# Patient Record
Sex: Male | Born: 1960 | Race: White | Hispanic: No | Marital: Married | Smoking: Current every day smoker
Health system: Southern US, Community
[De-identification: ages and names within clinical notes are randomized; demographics above are authoritative.]

## PROBLEM LIST (undated history)

## (undated) DIAGNOSIS — Z86010 Personal history of colonic polyps: Secondary | ICD-10-CM

## (undated) HISTORY — DX: Personal history of colonic polyps: Z86.010

## (undated) HISTORY — PX: COLONOSCOPY: SHX174

---

## 2000-07-04 ENCOUNTER — Encounter: Admission: RE | Admit: 2000-07-04 | Discharge: 2000-07-04 | Payer: Self-pay | Admitting: Family Medicine

## 2000-07-04 ENCOUNTER — Encounter: Payer: Self-pay | Admitting: Family Medicine

## 2009-08-06 ENCOUNTER — Ambulatory Visit: Payer: Self-pay | Admitting: Diagnostic Radiology

## 2009-08-06 ENCOUNTER — Emergency Department (HOSPITAL_BASED_OUTPATIENT_CLINIC_OR_DEPARTMENT_OTHER): Admission: EM | Admit: 2009-08-06 | Discharge: 2009-08-06 | Payer: Self-pay | Admitting: Emergency Medicine

## 2009-08-11 ENCOUNTER — Emergency Department (HOSPITAL_BASED_OUTPATIENT_CLINIC_OR_DEPARTMENT_OTHER): Admission: EM | Admit: 2009-08-11 | Discharge: 2009-08-11 | Payer: Self-pay | Admitting: Emergency Medicine

## 2010-02-01 ENCOUNTER — Other Ambulatory Visit: Payer: Self-pay | Admitting: Emergency Medicine

## 2010-02-01 ENCOUNTER — Observation Stay (HOSPITAL_COMMUNITY): Admission: AD | Admit: 2010-02-01 | Discharge: 2010-02-03 | Payer: Self-pay | Admitting: Internal Medicine

## 2010-03-26 ENCOUNTER — Encounter (INDEPENDENT_AMBULATORY_CARE_PROVIDER_SITE_OTHER): Payer: Self-pay | Admitting: *Deleted

## 2010-04-28 ENCOUNTER — Encounter (INDEPENDENT_AMBULATORY_CARE_PROVIDER_SITE_OTHER): Payer: Self-pay | Admitting: *Deleted

## 2010-04-30 ENCOUNTER — Ambulatory Visit: Payer: Self-pay | Admitting: Internal Medicine

## 2010-05-14 ENCOUNTER — Ambulatory Visit: Payer: Self-pay | Admitting: Internal Medicine

## 2010-05-14 DIAGNOSIS — Z860100 Personal history of colon polyps, unspecified: Secondary | ICD-10-CM

## 2010-05-14 HISTORY — DX: Personal history of colon polyps, unspecified: Z86.0100

## 2010-05-14 HISTORY — DX: Personal history of colonic polyps: Z86.010

## 2010-05-24 ENCOUNTER — Encounter: Payer: Self-pay | Admitting: Internal Medicine

## 2010-08-31 NOTE — Miscellaneous (Signed)
Summary: [facility]  Clinical Lists Changes  Medications: Added new medication of MOVIPREP 100 GM  SOLR (PEG-KCL-NACL-NASULF-NA ASC-C) As per prep instructions. - Signed Rx of MOVIPREP 100 GM  SOLR (PEG-KCL-NACL-NASULF-NA ASC-C) As per prep instructions.;  #1 x 0;  Signed;  Entered by: Ezra Sites RN;  Authorized by: Iva Boop MD, [facility];  Method used: Electronically to [facility]. [phone]*, [address] [place], [place]  [address], Ph: [phone], Fax: [phone] Observations: Added new observation of NKA: T (04/30/2010 15:17)    Prescriptions: MOVIPREP 100 GM  SOLR (PEG-KCL-NACL-NASULF-NA ASC-C) As per prep instructions.  #1 x 0   Entered by:   Ezra Sites RN   Authorized by:   Iva Boop MD, [facility]   Signed by:   Ezra Sites RN on 04/30/2010   Method used:   Electronically to        [facility] [phone]* (retail)       [address]       [place], [place]  [address]       Ph: [phone]       Fax: [phone]   RxID:   [id number]

## 2010-08-31 NOTE — Letter (Signed)
Summary: Moviprep Instructions  [facility]  [address]   [place], [place] [address]   Phone: [phone]  Fax: [phone]       Chad Bass    01-26-1961    MRN: [id number]        Procedure Day Dorna Bloom: Friday, 05-14-10     Arrival Time: 10:30 a.m.     Procedure Time: 11:30 a.m.     Location of Procedure:                    x  [facility] (4th Floor)   PREPARATION FOR COLONOSCOPY WITH MOVIPREP   Starting 5 days prior to your procedure 05-09-10 do not eat nuts, seeds, popcorn, corn, beans, peas,  salads, or any raw vegetables.  Do not take any fiber supplements (e.g. Metamucil, Citrucel, and Benefiber).  THE DAY BEFORE YOUR PROCEDURE         DATE: 05-13-10  DAY: Thursday  1.  Drink clear liquids the entire day-NO SOLID FOOD  2.  Do not drink anything colored red or purple.  Avoid juices with pulp.  No orange juice.  3.  Drink at least 64 oz. (8 glasses) of fluid/clear liquids during the day to prevent dehydration and help the prep work efficiently.  CLEAR LIQUIDS INCLUDE: Water Jello Ice Popsicles Tea (sugar ok, no milk/cream) Powdered fruit flavored drinks Coffee (sugar ok, no milk/cream) Gatorade Juice: apple, white grape, white cranberry  Lemonade Clear bullion, consomm, broth Carbonated beverages (any kind) Strained chicken noodle soup Hard Candy                             4.  In the morning, mix first dose of MoviPrep solution:    Empty 1 Pouch A and 1 Pouch B into the disposable container    Add lukewarm drinking water to the top line of the container. Mix to dissolve    Refrigerate (mixed solution should be used within 24 hrs)  5.  Begin drinking the prep at 5:00 p.m. The MoviPrep container is divided by 4 marks.   Every 15 minutes drink the solution down to the next mark (approximately 8 oz) until the full liter is complete.   6.  Follow completed prep with 16 oz of clear liquid of your choice (Nothing red or  purple).  Continue to drink clear liquids until bedtime.  7.  Before going to bed, mix second dose of MoviPrep solution:    Empty 1 Pouch A and 1 Pouch B into the disposable container    Add lukewarm drinking water to the top line of the container. Mix to dissolve    Refrigerate  THE DAY OF YOUR PROCEDURE      DATE: 05-14-10  DAY: Friday  Beginning at  6:30 a.m. (5 hours before procedure):         1. Every 15 minutes, drink the solution down to the next mark (approx 8 oz) until the full liter is complete.  2. Follow completed prep with 16 oz. of clear liquid of your choice.    3. You may drink clear liquids until 9:30 a.m.  (2 HOURS BEFORE PROCEDURE).   MEDICATION INSTRUCTIONS  Unless otherwise instructed, you should take regular prescription medications with a small sip of water   as early as possible the morning of your procedure.           OTHER INSTRUCTIONS  You will need a  responsible adult at least 51 years of age to accompany you and drive you home.   This person must remain in the waiting room during your procedure.  Wear loose fitting clothing that is easily removed.  Leave jewelry and other valuables at home.  However, you may wish to bring a book to read or  an iPod/MP3 player to listen to music as you wait for your procedure to start.  Remove all body piercing jewelry and leave at home.  Total time from sign-in until discharge is approximately 2-3 hours.  You should go home directly after your procedure and rest.  You can resume normal activities the  day after your procedure.  The day of your procedure you should not:   Drive   Make legal decisions   Operate machinery   Drink alcohol   Return to work  You will receive specific instructions about eating, activities and medications before you leave.    The above instructions have been reviewed and explained to me by   Ezra Sites RN  April 30, 2010 3:38 PM     I fully understand and  can verbalize these instructions _____________________________ Date _________

## 2010-08-31 NOTE — Procedures (Signed)
Summary: Colonoscopy  Patient: [name] Note: All result statuses are Final unless otherwise noted.  Tests: (1) Colonoscopy (COL)   COL Colonoscopy           DONE (C)      [facility]     [address]     [place], [place]  [address]           COLONOSCOPY PROCEDURE REPORT           PATIENT:  Chad Bass, Chad Bass  MR#:  [id number]     BIRTHDATE:  29-Jun-1961, 49 yrs. old  GENDER:  male     ENDOSCOPIST:  Iva Boop, MD, [facility]     REF. BY:  Marinda Elk, M.D.     PROCEDURE DATE:  05/14/2010     PROCEDURE:  Colonoscopy with snare polypectomy     ASA CLASS:  Class I     INDICATIONS:  Routine Risk Screening     MEDICATIONS:   Fentanyl 50 mcg IV, Versed 5 mg IV           DESCRIPTION OF PROCEDURE:   After the risks benefits and     alternatives of the procedure were thoroughly explained, informed     consent was obtained.  Digital rectal exam was performed and     revealed no abnormalities and normal prostate.   The LB 180AL     [id number] endoscope was introduced through the anus and advanced to     the cecum, which was identified by both the appendix and ileocecal     valve, without limitations.  The quality of the prep was     excellent, using MoviPrep.  The instrument was then slowly     withdrawn as the colon was fully examined.     Insertion: 3:40 minutes Withdrawal: 8:47 minutes     <<PROCEDUREIMAGES>>           FINDINGS:  Three polyps were found. Descending (2) and rectum (1).     The rectal (3mm) and smaller descending colon polyp (57mm)were     snared without cautery. Retrieval was successful. The 10 mm     descending polyp was snared, then cauterized with monopolar     cautery. Retrieval was successful. snare polyp  Mild     diverticulosis was found in the sigmoid colon.  This was otherwise     a normal examination of the colon.   Retroflexed views in the     right colon and  rectum revealed no abnormalities.    The scope     was then withdrawn from the patient and  the procedure completed.           COMPLICATIONS:  None     ENDOSCOPIC IMPRESSION:     1) Three polyps removed, largest 10mm.     2) Mild diverticulosis in the sigmoid colon     3) Otherwise normal examination, excellent prep           REPEAT EXAM:  In for Colonoscopy, pending biopsy results.           ADDENDUM:  No aspirin or NSAID's for 1 week.           Iva Boop, MD, [facility]           CC:  Marinda Elk, MD     The Patient           n.     REVISED:  05/14/2010 12:41 PM  eSIGNED:   Iva Boop at 05/14/2010 12:41 PM           Bonita Quin, [id number]  Note: An exclamation mark (!) indicates a result that was not dispersed into the flowsheet. Document Creation Date: 05/14/2010 12:42 PM _______________________________________________________________________  (1) Order result status: Final Collection or observation date-time: 05/14/2010 12:29 Requested date-time:  Receipt date-time:  Reported date-time:  Referring Physician:   Ordering Physician: Stan Head [phone]) Specimen Source:  Source: Launa Grill Order Number: [id number] Lab site:   Appended Document: Colonoscopy: 2 adenomas, max 10 mm   Colonoscopy  Procedure date:  05/14/2010  Findings:          1) Three polyps removed, largest 10mm. (TWO TUB ADENOMAS AND ONE HYPERPLASTIC)     2) Mild diverticulosis in the sigmoid colon     3) Otherwise normal examination, excellent prep  Comments:      Repeat colonoscopy in 3 years.     Procedures Next Due Date:    Colonoscopy: 05/2013   Appended Document: Colonoscopy     Procedures Next Due Date:    Colonoscopy: 05/2013

## 2010-08-31 NOTE — Letter (Signed)
Summary: Patient Notice- Polyp Results  [facility]  [address]   [place], [place] [address]   Phone: [phone]  Fax: [phone]        May 24, 2010 MRN: [id number]    Chad Bass [address] [place], [place]  [address]    Dear Mr. Claflin,  The polyps removed from your colon were adenomatous. This means that they were pre-cancerous or that  they had the potential to change into cancer over time.   I recommend that you have a repeat colonoscopy in 3 years to determine if you have developed any new polyps over time and to screen for colorectal cancer. If you develop any new rectal bleeding, abdominal pain or significant bowel habit changes, please contact us before then.  In addition to repeating colonoscopy, changing health habits may reduce your risk of having more colon polyps and possibly, colon cancer. You may lower your risk of future polyps and colon cancer by adopting healthy habits such as not smoking or using tobacco (if you do), being physically active, losing weight (if overweight), and eating a diet which includes fruits and vegetables and limits red meat.  Please call us if you are having persistent problems or have questions about your condition that have not been fully answered at this time.  Sincerely,  Iva Boop MD, [facility]  This letter has been electronically signed by your physician.  Appended Document: Patient Notice- Polyp Results letter mailed

## 2010-08-31 NOTE — Letter (Signed)
Summary: Previsit letter  [facility]  [address] [place], [place] [address]   Phone: [phone]  Fax: [phone]       03/26/2010 MRN: [id number]  Chad Bass [address] [place], [place]  [address]  Dear [name],  Welcome to the Gastroenterology Division at [facility].    You are scheduled to see a nurse for your pre-procedure visit on 04/30/2010 at 3:30pm  on the 3rd floor at [facility], [address]  We ask that you try to arrive at our office 15 minutes prior to your appointment time to allow for check-in.  Your nurse visit will consist of discussing your medical and surgical history, your immediate family medical history, and your medications.    Please bring a complete list of all your medications or, if you prefer, bring the medication bottles and we will list them.  We will need to be aware of both prescribed and over the counter drugs.  We will need to know exact dosage information as well.  If you are on blood thinners (Coumadin, Plavix, Aggrenox, Ticlid, etc.) please call our office today/prior to your appointment, as we need to consult with your physician about holding your medication.   Please be prepared to read and sign documents such as consent forms, a financial agreement, and acknowledgement forms.  If necessary, and with your consent, a friend or relative is welcome to sit-in on the nurse visit with you.  Please bring your insurance card so that we may make a copy of it.  If your insurance requires a referral to see a specialist, please bring your referral form from your primary care physician.  No co-pay is required for this nurse visit.     If you cannot keep your appointment, please call [phone] to cancel or reschedule prior to your appointment date.  This allows [place] the opportunity to schedule an appointment for another patient in need of care.    Thank you for choosing [facility] for your medical  needs.  We appreciate the opportunity to care for you.  Please visit [place] at our website  to learn more about our practice.                     Sincerely.                                                                                                                   The Gastroenterology Division

## 2010-10-17 LAB — URINALYSIS, ROUTINE W REFLEX MICROSCOPIC
Bilirubin Urine: NEGATIVE
Ketones, ur: NEGATIVE mg/dL
Ketones, ur: NEGATIVE mg/dL
Nitrite: POSITIVE — AB
Nitrite: NEGATIVE
Protein, ur: NEGATIVE mg/dL
Specific Gravity, Urine: 1.015 (ref 1.005–1.030)
pH: 6.5 (ref 5.0–8.0)
pH: 7 (ref 5.0–8.0)

## 2010-10-17 LAB — BASIC METABOLIC PANEL
BUN: 4 mg/dL — ABNORMAL LOW (ref 6–23)
CO2: 26 mEq/L (ref 19–32)
Chloride: 105 mEq/L (ref 96–112)
Potassium: 4 mEq/L (ref 3.5–5.1)

## 2010-10-17 LAB — URINE MICROSCOPIC-ADD ON

## 2010-10-17 LAB — COMPREHENSIVE METABOLIC PANEL
ALT: 10 U/L (ref 0–53)
BUN: 8 mg/dL (ref 6–23)
CO2: 26 mEq/L (ref 19–32)
Calcium: 9.6 mg/dL (ref 8.4–10.5)
Creatinine, Ser: 0.9 mg/dL (ref 0.4–1.5)
GFR calc non Af Amer: >60 mL/min (ref >60)
Glucose, Bld: 110 mg/dL — ABNORMAL HIGH (ref 70–99)
Total Protein: 7.6 g/dL (ref 6.0–8.3)

## 2010-10-17 LAB — CBC
HCT: 46 % (ref 39.0–52.0)
HCT: 39.2 % (ref 39.0–52.0)
Hemoglobin: 15.8 g/dL (ref 13.0–17.0)
MCH: 35.2 pg — ABNORMAL HIGH (ref 26.0–34.0)
MCHC: 34.3 g/dL (ref 30.0–36.0)
MCV: 102.5 fL — ABNORMAL HIGH (ref 78.0–100.0)
MCV: 103.4 fL — ABNORMAL HIGH (ref 78.0–100.0)
RDW: 14.3 % (ref 11.5–15.5)
RDW: 15.2 % (ref 11.5–15.5)
WBC: 9.7 10*3/uL (ref 4.0–10.5)

## 2010-10-17 LAB — URINE CULTURE: Colony Count: >=100000

## 2010-10-17 LAB — DIFFERENTIAL
Basophils Relative: 1 % (ref 0–1)
Eosinophils Relative: 0 % (ref 0–5)
Lymphocytes Relative: 8 % — ABNORMAL LOW (ref 12–46)
Monocytes Absolute: 0.9 10*3/uL (ref 0.1–1.0)
Neutrophils Relative %: 87 % — ABNORMAL HIGH (ref 43–77)

## 2010-12-07 ENCOUNTER — Ambulatory Visit (HOSPITAL_BASED_OUTPATIENT_CLINIC_OR_DEPARTMENT_OTHER)
Admission: RE | Admit: 2010-12-07 | Discharge: 2010-12-07 | Disposition: A | Discharge status: Home / Self Care | Payer: BC Managed Care – PPO | Source: Ambulatory Visit | Attending: Orthopedic Surgery | Admitting: Orthopedic Surgery

## 2010-12-07 DIAGNOSIS — M2240 Chondromalacia patellae, unspecified knee: Secondary | ICD-10-CM | POA: Insufficient documentation

## 2010-12-07 DIAGNOSIS — M23329 Other meniscus derangements, posterior horn of medial meniscus, unspecified knee: Secondary | ICD-10-CM | POA: Insufficient documentation

## 2010-12-07 DIAGNOSIS — Z01810 Encounter for preprocedural cardiovascular examination: Secondary | ICD-10-CM | POA: Insufficient documentation

## 2010-12-07 DIAGNOSIS — M23359 Other meniscus derangements, posterior horn of lateral meniscus, unspecified knee: Secondary | ICD-10-CM | POA: Insufficient documentation

## 2010-12-21 NOTE — Op Note (Signed)
  NAMEERLIN, GARDELLA               ACCOUNT NO.:  [id number]  MEDICAL RECORD NO.:  [id number]            PATIENT TYPE:  LOCATION:                                 FACILITY:  PHYSICIAN:  Elana Alm. Thurston Hole, M.D.      DATE OF BIRTH:  DATE OF PROCEDURE:  12/07/2010 DATE OF DISCHARGE:                              OPERATIVE REPORT   PREOPERATIVE DIAGNOSIS:  Right knee medial and lateral meniscal tears with chondromalacia.  POSTOPERATIVE DIAGNOSIS:  Right knee medial and lateral meniscal tears with chondromalacia.  PROCEDURE:  Right knee exam under anesthesia followed by arthroscopic partial medial and lateral meniscectomies with chondroplasty.  SURGEON:  Elana Alm. Thurston Hole, MD  ANESTHESIA:  General.  OPERATIVE TIME:  30 minutes.  COMPLICATIONS:  None.  INDICATIONS FOR PROCEDURE:  Mr. Lipke is a 50 year old gentleman who has had significant right knee pain for the past 6 weeks with exam and MRI documenting meniscal tearing with chondromalacia.  He has failed conservative care and is now to undergo arthroscopy.  DESCRIPTION:  Mr. Gartman was brought to the operating room on Dec 07, 2010, after a knee block was placed in the holding room by Anesthesia. He was placed on operative table in supine position.  After being placed under general anesthesia, his right knee was examined.  He had full range of motion.  Knee was stable ligamentous exam with normal patellar tracking.  The right leg was prepped using sterile DuraPrep and draped using sterile technique.  Initially through an anterolateral portal, the arthroscope with a pump attached was placed through an anteromedial portal and arthroscopic probe was placed.  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  Medial meniscus showed tear of the posteromedial horn of which 30-40% was resected back to a stable rim.  Intercondylar notch was inspected.  Anterior and  posterior cruciate ligaments were normal.  Lateral compartment was inspected.  The articular cartilage was normal.  The lateral meniscus showed a tear of the posterolateral corner of which 30% was resected back to a stable rim.  Patellofemoral joint articular cartilage was normal.  The patella tracked normally.  Medial and lateral gutters were free of pathology.  After this done, it was felt that all pathology have been satisfactorily addressed.  The instruments were removed.  Portals were closed with 3-0 nylon suture. Sterile dressings were applied and the patient awakened and taken to recovery room in stable condition.  FOLLOWUP CARE:  Mr. Bontempo will be followed as an outpatient on Norco for pain.  He will be back in the office in a week for sutures out and followup.     Leshon Armistead A. Thurston Hole, M.D.     RAW/MEDQ  D:  12/07/2010  T:  12/08/2010  Job:  [id number]  Electronically Signed by Salvatore Marvel M.D. on 12/21/2010 05:23:53 PM

## 2011-01-30 IMAGING — CR DG ANKLE COMPLETE 3+V*R*
3 series · 3 of 3 positions shown · non-contrast
Comparison: None

CLINICAL DATA: Fall, ankle injury, pain.

RIGHT ANKLE - COMPLETE 3+ VIEW

[t ankle joint ap right]
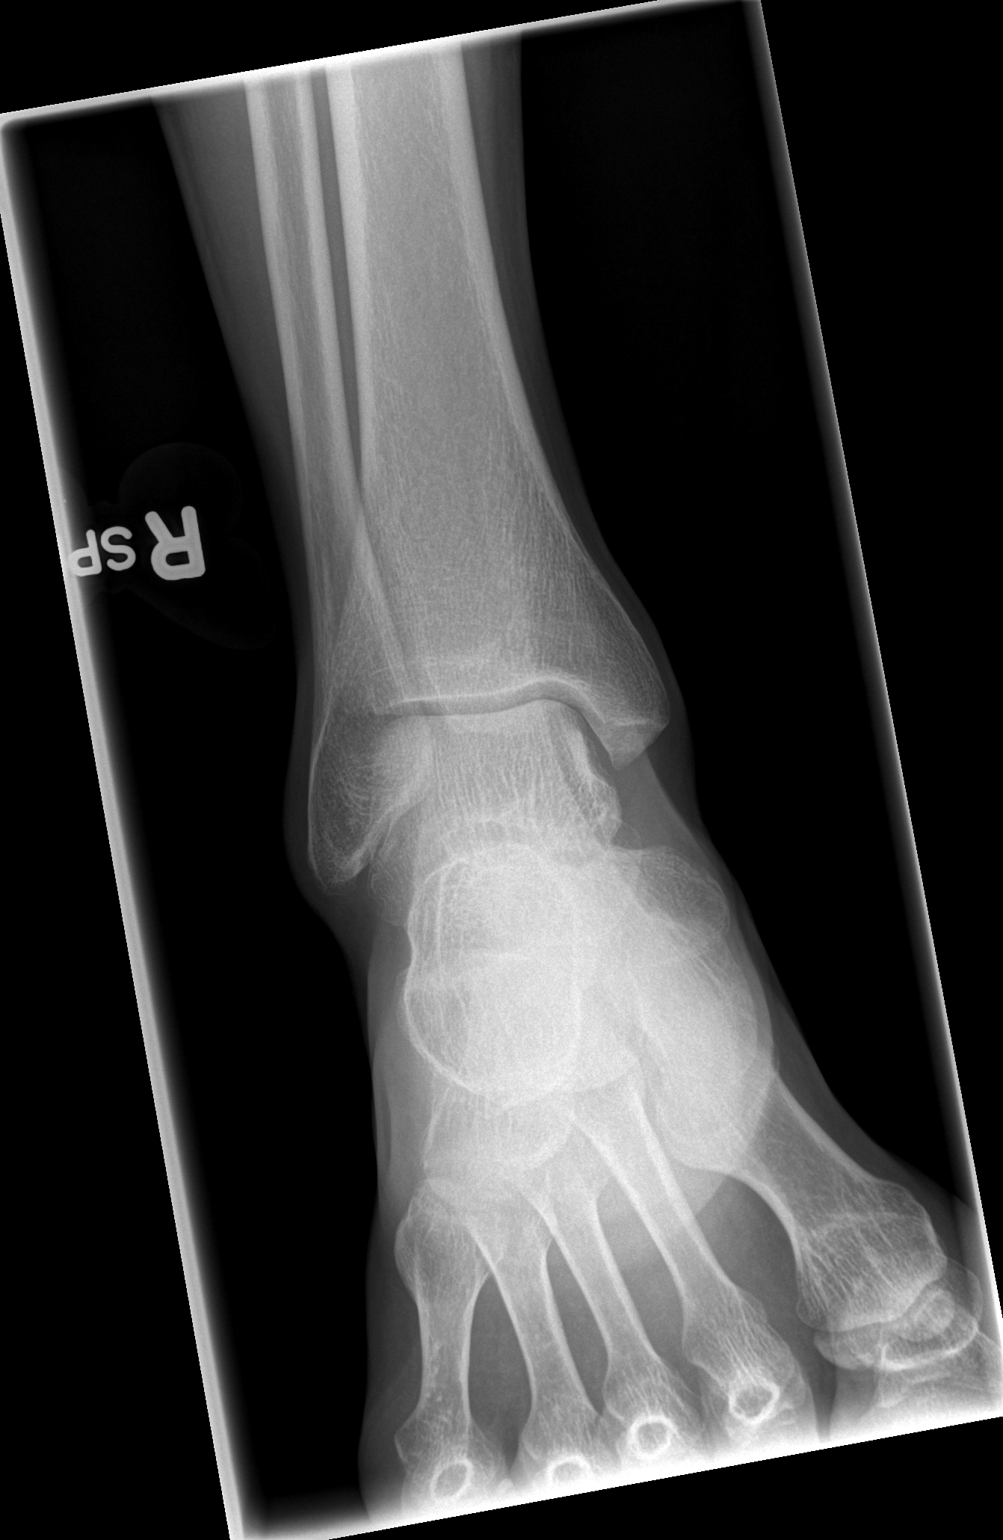

[t ankle joint oblique right]
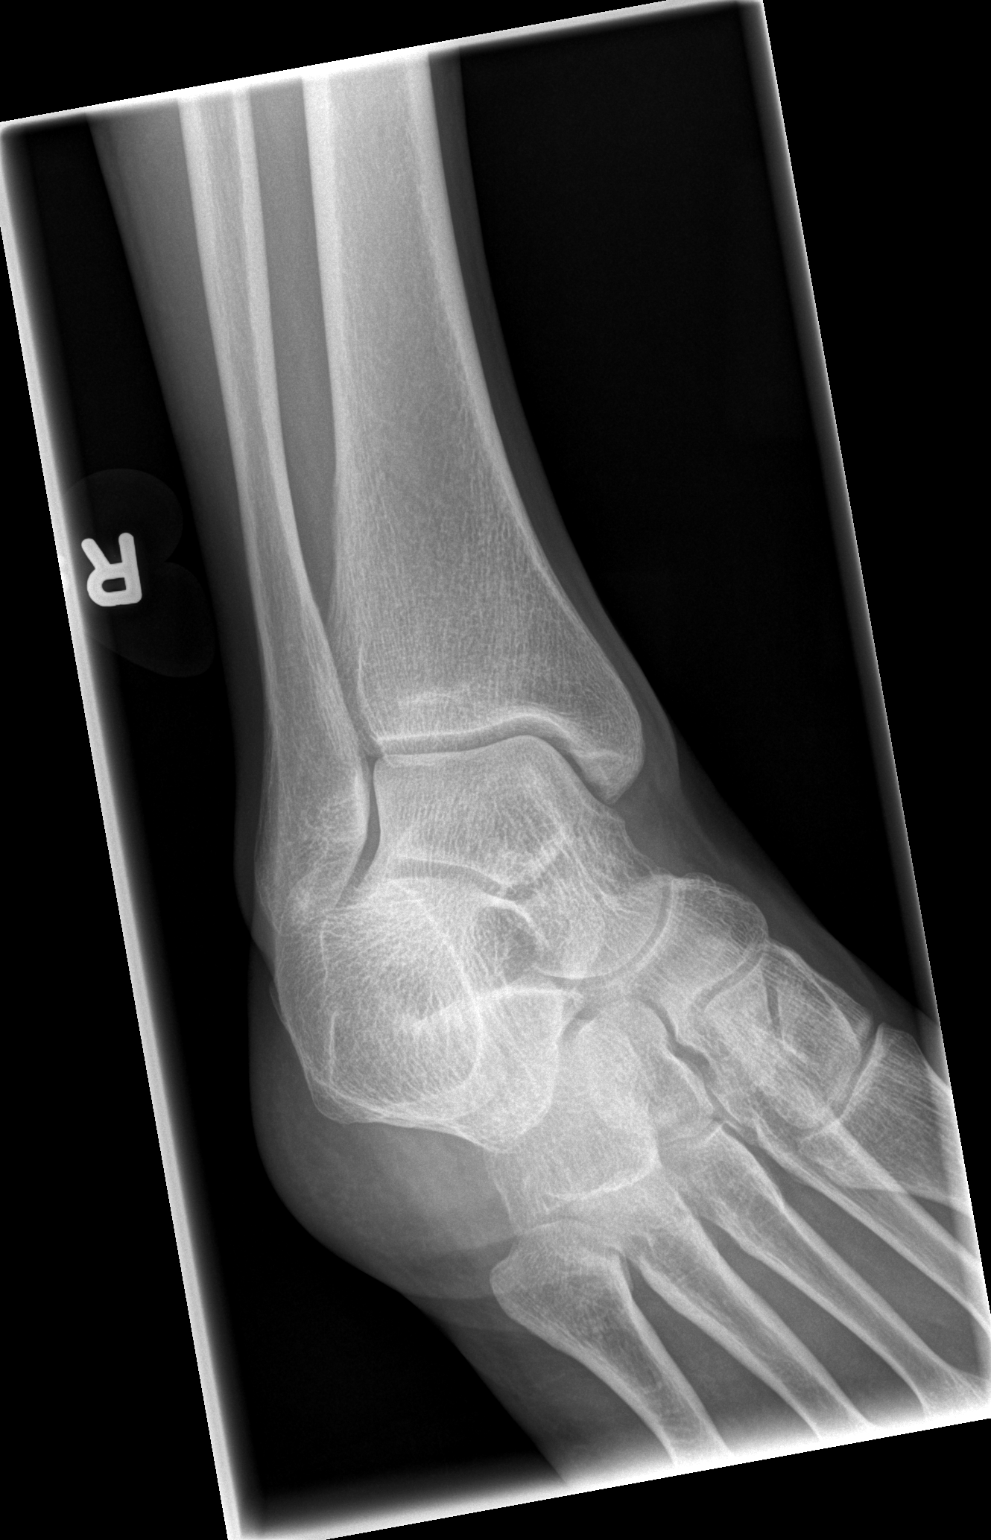

[t ankle joint lat right]
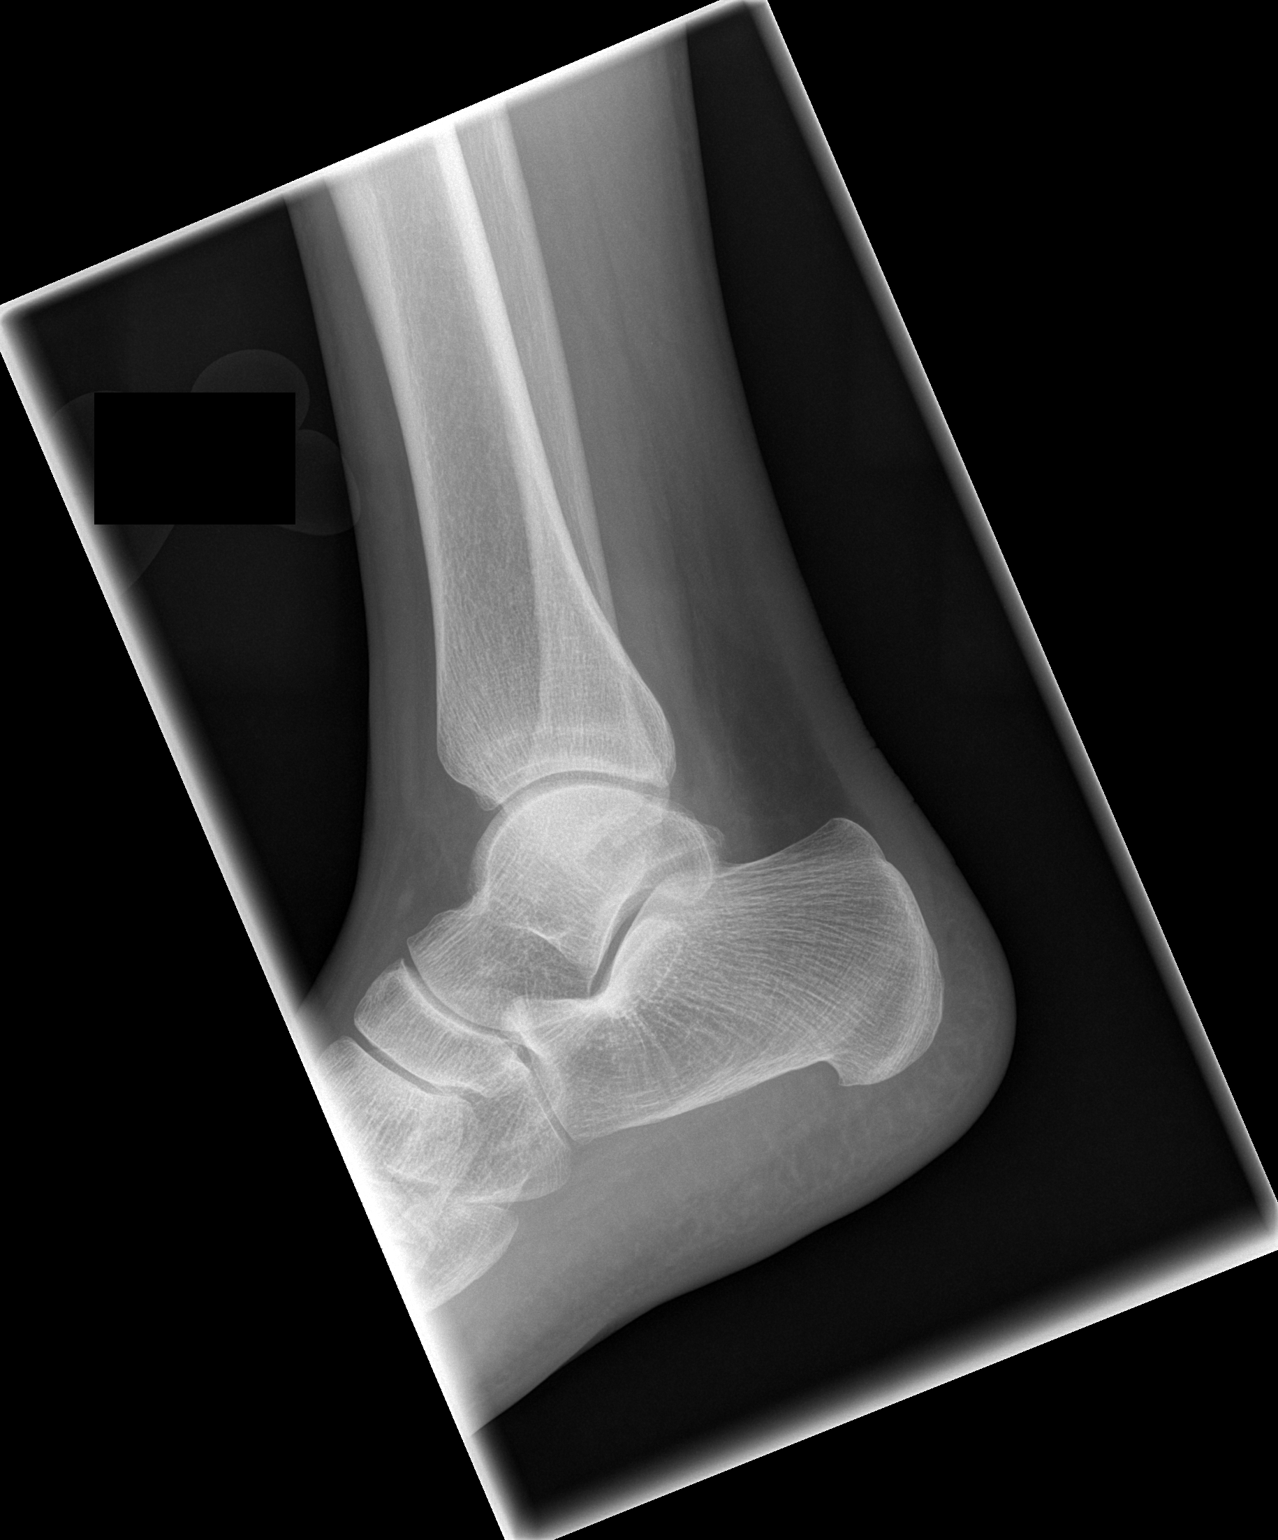

[3 of 3 positions shown; findings below may reference images not displayed]

FINDINGS: No acute bony abnormality.  Specifically, no fracture,
subluxation, or dislocation.  Soft tissues are intact.
IMPRESSION: No acute bony abnormality.

## 2011-07-28 IMAGING — CR DG CHEST 2V
2 series · 2 of 2 positions shown · non-contrast
Comparison: None.

CLINICAL DATA: Fever, nausea, vomiting and diarrhea; urinary tract
infection.

CHEST - 2 VIEW

[w chest pa]
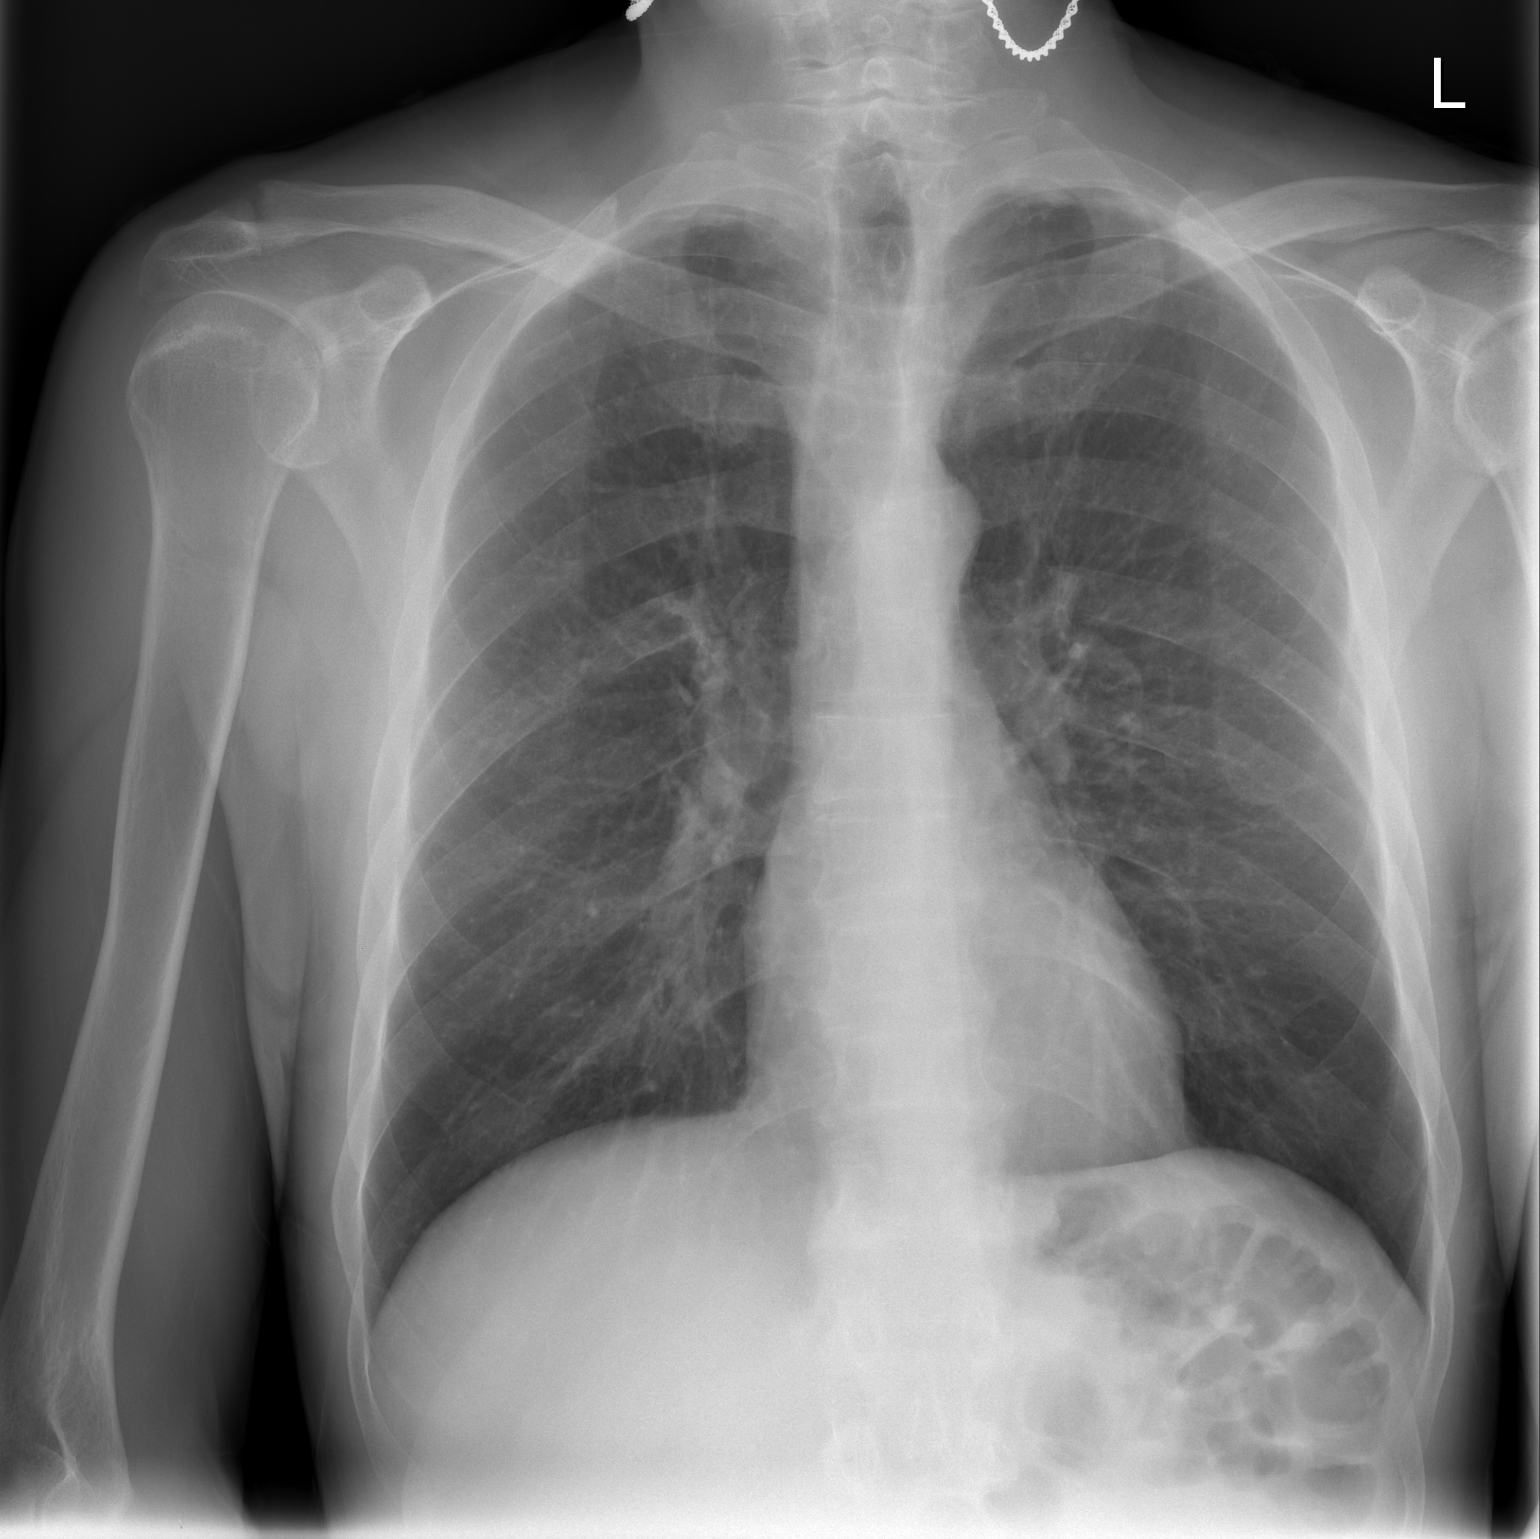

[w chest lat]
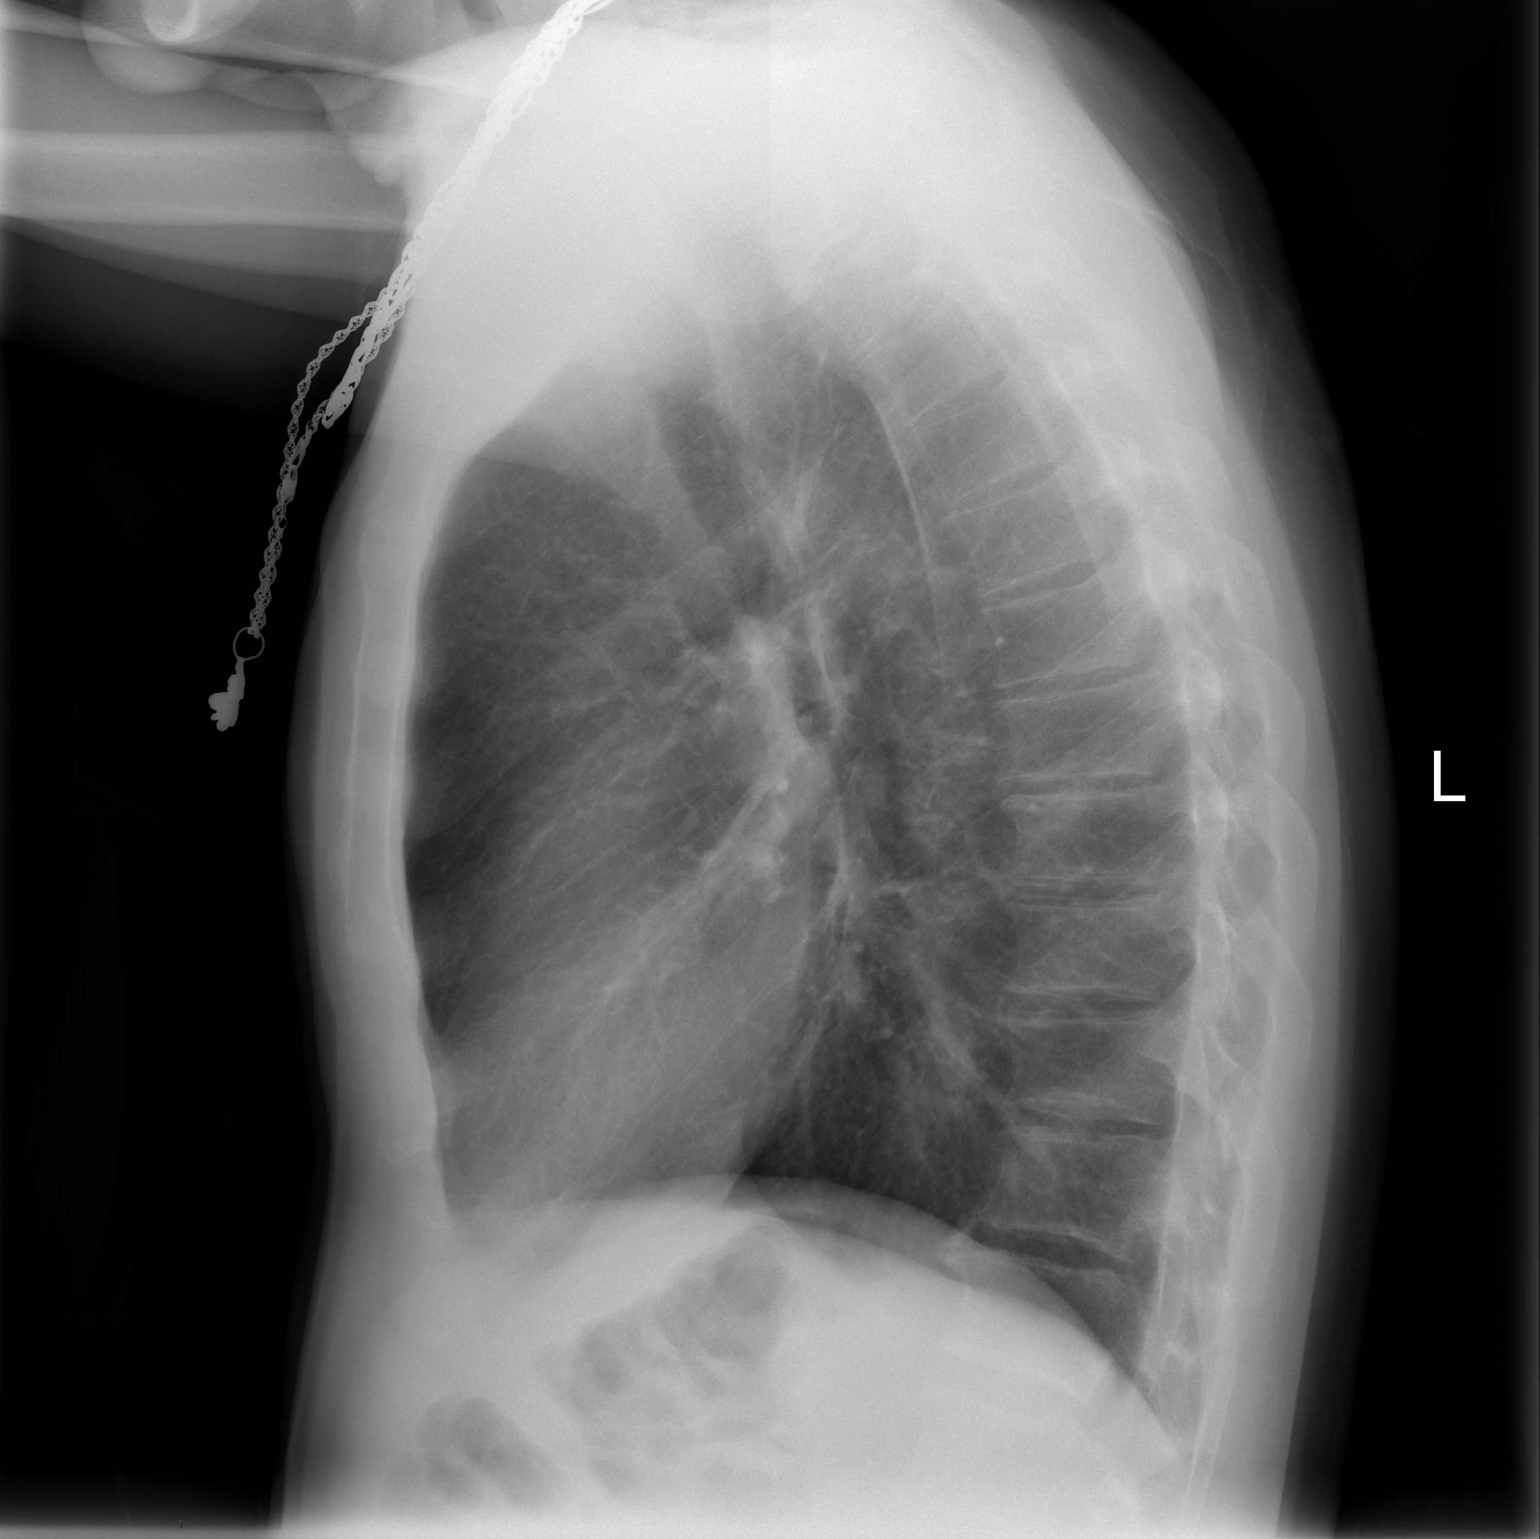

[2 of 2 positions shown; findings below may reference images not displayed]

FINDINGS: The lungs are well-aerated.  On correlation with the
lateral view, there is question of a mild focal right lower lobe
airspace opacity, raising concern for mild pneumonia.  There is no
evidence of pleural effusion or pneumothorax.

The heart is normal in size; the mediastinal contour is within
normal limits.  No acute osseous abnormalities are seen.
IMPRESSION: Question of mild right lower lobe pneumonia.

## 2013-05-13 ENCOUNTER — Encounter: Payer: Self-pay | Admitting: Internal Medicine

## 2013-05-15 ENCOUNTER — Encounter: Payer: Self-pay | Admitting: Internal Medicine

## 2013-06-17 ENCOUNTER — Encounter: Payer: Self-pay | Admitting: Internal Medicine

## 2013-07-30 ENCOUNTER — Ambulatory Visit (AMBULATORY_SURGERY_CENTER): Payer: Self-pay

## 2013-07-30 VITALS — Ht 66.0 in | Wt 140.0 lb

## 2013-07-30 DIAGNOSIS — Z860100 Personal history of colon polyps, unspecified: Secondary | ICD-10-CM

## 2013-07-30 MED ORDER — SUPREP BOWEL PREP KIT 17.5-3.13-1.6 GM/177ML PO SOLN: 1.0000 | Freq: Once | ORAL | Status: DC

## 2013-07-31 ENCOUNTER — Encounter: Payer: Self-pay | Admitting: Internal Medicine

## 2013-08-15 ENCOUNTER — Encounter: Payer: BC Managed Care – PPO | Admitting: Internal Medicine

## 2013-08-15 ENCOUNTER — Ambulatory Visit (AMBULATORY_SURGERY_CENTER): Payer: BC Managed Care – PPO | Admitting: Internal Medicine

## 2013-08-15 ENCOUNTER — Encounter: Payer: Self-pay | Admitting: Internal Medicine

## 2013-08-15 VITALS — BP 115/76 | HR 67 | Temp 96.8°F | Resp 16 | Ht 66.0 in | Wt 140.0 lb

## 2013-08-15 DIAGNOSIS — Z860100 Personal history of colon polyps, unspecified: Secondary | ICD-10-CM

## 2013-08-15 DIAGNOSIS — D126 Benign neoplasm of colon, unspecified: Secondary | ICD-10-CM

## 2013-08-15 DIAGNOSIS — K5730 Diverticulosis of large intestine without perforation or abscess without bleeding: Secondary | ICD-10-CM

## 2013-08-15 MED ORDER — SODIUM CHLORIDE 0.9 % IV SOLN: 500.0000 mL | INTRAVENOUS | Status: DC

## 2013-08-15 NOTE — Patient Instructions (Addendum)
I found and removed 2 polyps today.  You also have a condition called diverticulosis - common and not usually a problem. Please read the handout provided.  I will let you know pathology results and when to have another routine colonoscopy by mail.  I appreciate the opportunity to care for you. Gatha Mayer, MD, FACG  YOU HAD AN ENDOSCOPIC PROCEDURE TODAY AT [facility]: Refer to the procedure report that was given to you for any specific questions about what was found during the examination.  If the procedure report does not answer your questions, please call your gastroenterologist to clarify.  If you requested that your care partner not be given the details of your procedure findings, then the procedure report has been included in a sealed envelope for you to review at your convenience later.  YOU SHOULD EXPECT: Some feelings of bloating in the abdomen. Passage of more gas than usual.  Walking can help get rid of the air that was put into your GI tract during the procedure and reduce the bloating. If you had a lower endoscopy (such as a colonoscopy or flexible sigmoidoscopy) you may notice spotting of blood in your stool or on the toilet paper. If you underwent a bowel prep for your procedure, then you may not have a normal bowel movement for a few days.  DIET: Your first meal following the procedure should be a light meal and then it is ok to progress to your normal diet.  A half-sandwich or bowl of soup is an example of a good first meal.  Heavy or fried foods are harder to digest and may make you feel nauseous or bloated.  Likewise meals heavy in dairy and vegetables can cause extra gas to form and this can also increase the bloating.  Drink plenty of fluids but you should avoid alcoholic beverages for 24 hours.  ACTIVITY: Your care partner should take you home directly after the procedure.  You should plan to take it easy, moving slowly for the rest of the day.  You can  resume normal activity the day after the procedure however you should NOT DRIVE or use heavy machinery for 24 hours (because of the sedation medicines used during the test).    SYMPTOMS TO REPORT IMMEDIATELY: A gastroenterologist can be reached at any hour.  During normal business hours, 8:30 AM to 5:00 PM Monday through Friday, call [phone].  After hours and on weekends, please call the GI answering service at [phone] who will take a message and have the physician on call contact you.   Following lower endoscopy (colonoscopy or flexible sigmoidoscopy):  Excessive amounts of blood in the stool  Significant tenderness or worsening of abdominal pains  Swelling of the abdomen that is new, acute  Fever of 100F or higher  FOLLOW UP: If any biopsies were taken you will be contacted by phone or by letter within the next 1-3 weeks.  Call your gastroenterologist if you have not heard about the biopsies in 3 weeks.  Our staff will call the home number listed on your records the next business day following your procedure to check on you and address any questions or concerns that you may have at that time regarding the information given to you following your procedure. This is a courtesy call and so if there is no answer at the home number and we have not heard from you through the emergency physician on call, we will assume that you  have returned to your regular daily activities without incident.  SIGNATURES/CONFIDENTIALITY: You and/or your care partner have signed paperwork which will be entered into your electronic medical record.  These signatures attest to the fact that that the information above on your After Visit Summary has been reviewed and is understood.  Full responsibility of the confidentiality of this discharge information lies with you and/or your care-partner.

## 2013-08-15 NOTE — Op Note (Signed)
[place]  [address]. [place], [address]   COLONOSCOPY PROCEDURE REPORT  PATIENT: Chad Bass, Chad Bass  MR#: [id number] BIRTHDATE: [birth year] , 52  yrs. old GENDER: Male ENDOSCOPIST: Gatha Mayer, MD, [facility] PROCEDURE DATE:  08/15/2013 PROCEDURE:   Colonoscopy with snare polypectomy First Screening Colonoscopy - Avg.  risk and is 50 yrs.  old or older - No.  Prior Negative Screening - Now for repeat screening. N/A  History of Adenoma - Now for follow-up colonoscopy & has been > or = to 3 yrs.  Yes hx of adenoma.  Has been 3 or more years since last colonoscopy.  Polyps Removed Today? Yes. ASA CLASS:   Class II INDICATIONS:Patient's personal history of adenomatous colon polyps.  MEDICATIONS: Propofol (Diprivan) 340 mg IV, MAC sedation, administered by CRNA, and These medications were titrated to patient response per physician's verbal order  DESCRIPTION OF PROCEDURE:   After the risks benefits and alternatives of the procedure were thoroughly explained, informed consent was obtained.  A digital rectal exam revealed no abnormalities of the rectum, A digital rectal exam revealed no prostatic nodules, and A digital rectal exam revealed the prostate was not enlarged.   The LB [id number]  endoscope was introduced through the anus and advanced to the cecum, which was identified by both the appendix and ileocecal valve. No adverse events experienced.   The quality of the prep was excellent using Suprep  The instrument was then slowly withdrawn as the colon was fully examined.  COLON FINDINGS: Two sessile polyps measuring 3 and 8 mm in size were found in the ascending colon and descending colon.  A polypectomy was performed with a cold snare.  The resection was complete and the polyp tissue was completely retrieved.   Mild diverticulosis was noted in the sigmoid colon.   The colon mucosa was otherwise normal.   A right colon retroflexion was performed.   Retroflexed views revealed no abnormalities. The time to cecum=1 minutes 36 seconds.  Withdrawal time=13 minutes 21 seconds.  The scope was withdrawn and the procedure completed. COMPLICATIONS: There were no complications.  ENDOSCOPIC IMPRESSION: 1.   Two sessile polyps measuring 3 and 8 mm in size were found in the ascending colon and descending colon; polypectomy was performed with a cold snare 2.   Mild diverticulosis was noted in the sigmoid colon 3.   The colon mucosa was otherwise normal - excellent prep - second colonoscopy  RECOMMENDATIONS: Timing of repeat colonoscopy will be determined by pathology findings - in patient with prior adenomas (2) 2011  eSigned:  Gatha Mayer, MD, [facility] 08/15/2013 9:57 AM  cc: Briscoe Deutscher, MD and The Patient

## 2013-08-15 NOTE — Progress Notes (Signed)
Called to room to assist during endoscopic procedure.  Patient ID and intended procedure confirmed with present staff. Received instructions for my participation in the procedure from the performing physician.  

## 2013-08-15 NOTE — Progress Notes (Signed)
Report to pacu rn, vss, bbs=clear 

## 2013-08-16 ENCOUNTER — Telehealth: Payer: Self-pay | Admitting: *Deleted

## 2013-08-16 NOTE — Telephone Encounter (Signed)
  Follow up Call-  Call back number 08/15/2013  Post procedure Call Back phone  # [phone]  Permission to leave phone message Yes     Patient questions:  Do you have a fever, pain , or abdominal swelling? no Pain Score  0 *  Have you tolerated food without any problems? yes  Have you been able to return to your normal activities? yes  Do you have any questions about your discharge instructions: Diet   no Medications  no Follow up visit  no  Do you have questions or concerns about your Care? no  Actions: * If pain score is 4 or above: No action needed, pain <4.

## 2013-08-20 ENCOUNTER — Encounter: Payer: Self-pay | Admitting: Internal Medicine

## 2013-08-20 NOTE — Progress Notes (Signed)
Quick Note:  2 adenomas max 8 mm - repeat colonoscopy in 5 yrs 2020 ______

## 2018-10-10 ENCOUNTER — Encounter: Payer: Self-pay | Admitting: Internal Medicine

## 2023-11-13 DIAGNOSIS — Z1211 Encounter for screening for malignant neoplasm of colon: Secondary | ICD-10-CM | POA: Diagnosis not present

## 2023-11-13 DIAGNOSIS — K5730 Diverticulosis of large intestine without perforation or abscess without bleeding: Secondary | ICD-10-CM | POA: Diagnosis not present

## 2023-11-13 DIAGNOSIS — K640 First degree hemorrhoids: Secondary | ICD-10-CM | POA: Diagnosis not present

## 2023-11-13 DIAGNOSIS — I10 Essential (primary) hypertension: Secondary | ICD-10-CM | POA: Diagnosis not present

## 2023-11-13 DIAGNOSIS — I2510 Atherosclerotic heart disease of native coronary artery without angina pectoris: Secondary | ICD-10-CM | POA: Diagnosis not present

## 2023-11-13 DIAGNOSIS — Z79899 Other long term (current) drug therapy: Secondary | ICD-10-CM | POA: Diagnosis not present

## 2023-11-13 DIAGNOSIS — R195 Other fecal abnormalities: Secondary | ICD-10-CM | POA: Diagnosis not present

## 2023-11-13 DIAGNOSIS — F17210 Nicotine dependence, cigarettes, uncomplicated: Secondary | ICD-10-CM | POA: Diagnosis not present
